# Patient Record
Sex: Male | Born: 1985 | Race: White | Hispanic: No | Marital: Single | Smoking: Never smoker
Health system: Southern US, Community
[De-identification: ages and names within clinical notes are randomized; demographics above are authoritative.]

## PROBLEM LIST (undated history)

## (undated) DIAGNOSIS — F419 Anxiety disorder, unspecified: Secondary | ICD-10-CM

## (undated) DIAGNOSIS — F1520 Other stimulant dependence, uncomplicated: Secondary | ICD-10-CM

## (undated) DIAGNOSIS — B20 Human immunodeficiency virus [HIV] disease: Secondary | ICD-10-CM

## (undated) DIAGNOSIS — F1910 Other psychoactive substance abuse, uncomplicated: Secondary | ICD-10-CM

## (undated) DIAGNOSIS — A539 Syphilis, unspecified: Secondary | ICD-10-CM

## (undated) DIAGNOSIS — Z21 Asymptomatic human immunodeficiency virus [HIV] infection status: Secondary | ICD-10-CM

## (undated) HISTORY — DX: Other psychoactive substance abuse, uncomplicated: F19.10

## (undated) HISTORY — DX: Anxiety disorder, unspecified: F41.9

## (undated) HISTORY — DX: Human immunodeficiency virus (HIV) disease: B20

## (undated) HISTORY — DX: Other stimulant dependence, uncomplicated: F15.20

---
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
## 2013-01-05 DIAGNOSIS — B20 Human immunodeficiency virus [HIV] disease: Secondary | ICD-10-CM | POA: Insufficient documentation

## 2015-07-17 ENCOUNTER — Emergency Department
Admission: EM | Admit: 2015-07-17 | Discharge: 2015-07-17 | Disposition: A | Discharge status: Home / Self Care | Payer: 59 | Attending: Emergency Medicine | Admitting: Emergency Medicine

## 2015-07-17 ENCOUNTER — Emergency Department: Payer: Self-pay

## 2015-07-17 DIAGNOSIS — Z21 Asymptomatic human immunodeficiency virus [HIV] infection status: Secondary | ICD-10-CM | POA: Insufficient documentation

## 2015-07-17 DIAGNOSIS — F1910 Other psychoactive substance abuse, uncomplicated: Secondary | ICD-10-CM

## 2015-07-17 DIAGNOSIS — F15929 Other stimulant use, unspecified with intoxication, unspecified: Secondary | ICD-10-CM | POA: Insufficient documentation

## 2015-07-17 NOTE — Discharge Instructions (Signed)
Methamphetamine Abuse (Edu)     Your doctor wanted you to have this information about methamphetamine use.     What is it?     Methamphetamine is a psycho stimulant. This means that it stimulates the brain. It is also known as meth, speed, crystal meth, ice, glass, and several others.     How is it used?     Meth can be injected directly into the blood vessels, smoked, snorted, or swallowed. All routes are harmful, but injection into the veins (called the intravenous route) is the most dangerous.      Methamphetamines are sometimes found in FDA approved drugs for ADHD, narcolepsy, and morbid obesity. However, these are controlled substances and are not often prescribed.      What are the side-effects?     There are a wide range of side-effects of methamphetamines. The most common ones are psychosis, hyperactivity, low appetite, confusion, sweating, nausea, diarrhea, dangerously high body temperature, and anxiety.     Meth works by releasing high levels of neurotransmitters in the brain. Neurotransmitters let the brain cells communicate with each other. When these levels of neurotransmitters get very high, they can cause a person to have confusion, hallucinations, anxiety, trouble sleeping, seizures, and paranoia.      With long-term use, methamphetamines can cause behavioral problems. These problems may look like schizophrenia and Parkinson s disease, strokes, depression, or higher risk for suicide. It can also cause permanent damage to the lungs, heart, and blood vessels. Death is a very real complication both of short-term and long-term use.      Another common side effect of long-term methamphetamine use is "meth mouth." Meth users can quickly develop extremely poor dental hygiene. This often causes teeth to fall out and infected gums.      Methamphetamine is an extremely addictive drug. It can also cause tolerance. This means that a person has to use higher doses of the drug to get the same effect over time.  Withdrawal from the drug can last from days to weeks. Symptoms include headaches, very vivid dreams, more appetite, and depression.     Since methamphetamines are recreational drugs, they are often supplemented or "cut" with other unknown substances. As these substances are unknown, they also carry a risk for bad outcomes and complications.     Where to go from here?     Clearly, stopping all methamphetamine use is the best way to prevent long-term complications of the drug and the best way to avoid death. There are drugs that can help lower the side effects. However, they need to be prescribed by a doctor. It is important to be seen on a regular basis by a doctor to improve your chances of successfully quitting methamphetamines.

## 2015-07-17 NOTE — ED Provider Notes (Signed)
EMERGENCY DEPARTMENT HISTORY AND PHYSICAL EXAM     Physician/Midlevel provider first contact with patient: 07/17/15 0358         Date: 07/17/2015  Patient Name: Stephen Norris    History of Presenting Illness     Chief Complaint   Patient presents with   . Addiction Problem   . Withdrawal       History Provided By: Patient    Chief Complaint: Meth addiction   Onset: years ago  Timing: Constant  Location: Psychological  Quality: While being on methamphetamines  Severity: Moderate  Exacerbating factors: Meth use  Alleviating factors: None  Associated Symptoms: paranoia  Pertinent Negatives: No fever, nausea, vomiting, headache, neck pain, back pain, fall, trauma, SI, HI, or other illicit drug use    Additional History: Stephen Norris is a 30 y.o. male presenting to the ED with meth addiction. Pt thinks that he was being followed by the authorities. He says he is on methamphetamines, while this was happening, but thought that undercover cops were going to perform a "sting operation" on him. He was fired from his job recently in [place] and has been living with friends in [place] since then. Pt last used meth a few hours ago. He came to ED because he believes he is being followed by the police and that the ED is the only place where they cannot follow him. Pt was planning to go to rehab for methamphetamines. He also has HIV, not currently on medication.     PCP: Pcp, Noneorunknown, MD  SPECIALISTS:    No current facility-administered medications for this encounter.     Current Outpatient Prescriptions   Medication Sig Dispense Refill   . ALPRAZolam (XANAX) 1 MG tablet Take 1 mg by mouth nightly as needed for Sleep.     Marland Kitchen elvitegravir, cobicistat, emtricitabine, and tenofovir alafenamide (GENVOYA) 150-150-200-10 MG tablet Take 1 tablet by mouth daily.     . valacyclovir (VALTREX) 1000 MG tablet Take 1,000 mg by mouth 2 (two) times daily.         Past History     Past Medical History:  Past Medical History   Diagnosis Date    . HIV (human immunodeficiency virus infection)    . Anxiety    . Methamphetamine addiction        Past Surgical History:  Past Surgical History   Procedure Laterality Date   . Tonsillectomy     . Wisdom tooth extraction         Family History:  No family history on file.    Social History:  Social History   Substance Use Topics   . Smoking status: Never Smoker    . Smokeless tobacco: None   . Alcohol Use: No       Allergies:  No Known Allergies    Review of Systems     Review of Systems   Constitutional: Negative for fever and fatigue.   HENT: Negative for rhinorrhea and sore throat.    Eyes: Negative for discharge, redness and visual disturbance.   Respiratory: Negative for cough and shortness of breath.    Cardiovascular: Negative for chest pain and leg swelling.   Gastrointestinal: Negative for nausea and abdominal pain.   Endocrine: Negative for polyuria.   Genitourinary: Negative for dysuria, urgency, frequency and flank pain.   Musculoskeletal: Negative for back pain, neck pain and neck stiffness.   Skin: Negative for rash.   Allergic/Immunologic: Negative for food allergies.   Neurological:  Negative for light-headedness and headaches.   Hematological: Does not bruise/bleed easily.   Psychiatric/Behavioral: Negative for suicidal ideas.       Physical Exam   BP 121/81 mmHg  Pulse 96  Temp(Src) 97.5 F (36.4 C) (Oral)  Resp 20  SpO2 97%    Constitutional: Vital signs reviewed.  Well nourished  Head: Normocephalic, atraumatic  Eyes: Conjunctiva and sclera are normal.  No injection or discharge.  Ears, Nose, Throat:  Normal external examination of the nose and ears. No throat or oropharyngeal swelling or erythema. Midline uvula. Mucous membranes moist.  Neck: Normal range of motion. Supple, no meningeal signs. Trachea midline. No stridor. No JVD  Respiratory/Chest: Clear to auscultation. No respiratory distress.   Cardiovascular: Regular rate and rhythm. No murmurs.  Abdomen:  Bowel sounds intact. No  rebound or guarding. Soft.  Non-tender.  Back: No CVA tenderness to percussion. No focal tenderness.  Upper Extremity:  No edema. No cyanosis. Bilateral radial pulses intact and equal.   Lower Extremity:  No edema. No cyanosis. Bilateral calves symmetrical and non-tender. Bilateral femoral, DP, PT pulses intact and equal.  Skin: Warm and dry. No rash.  Neuro: A&Ox3. CNII -XII intact to testing. Strength 5/5 and symmetrical in the bilateral upper and lower extremities. Sensation to sharp touch intact and equal in the bilateral upper and lower extremities. Coordination intact to finger to nose testing. Normal gait.   Psychiatric: Anxious affect.  Denies SI or HI        Diagnostic Study Results     Labs -     Results     ** No results found for the last 24 hours. **          Radiologic Studies -   Radiology Results (24 Hour)     ** No results found for the last 24 hours. **      .    Medical Decision Making   I am the first provider for this patient.    I reviewed the vital signs, available nursing notes, past medical history, past surgical history, family history and social history.    Vital Signs-Reviewed the patient's vital signs.     Patient Vitals for the past 12 hrs:   BP Temp Pulse Resp   07/17/15 0400 121/81 mmHg 97.5 F (36.4 C) 96 20       Pulse Oximetry Analysis - Normal 97% on RA    Old Medical Records:   Nursing notes.     ED Course:     4:31 AM - Discussed with pt and counseled on diagnosis, f/u plans with IPAC, and signs and symptoms when to return to ED.  Pt voices understanding and agreement with plan. All questions and concerns addressed.         Provider Notes: Pt presenting to ED with meth intoxication which has been ongoing for years. Provided referral for outpatient resources.       Diagnosis     Clinical Impression:   1. Substance abuse        Treatment Plan:   ED Disposition     Discharge Stephen Norris discharge to home/self care.    Condition at disposition: Stable               _______________________________      Attestations: This note is prepared by Areatha Keas, acting as scribe for Lynnea Ferrier, MD.    Lynnea Ferrier, MD - The scribe's documentation has been prepared under my direction and  personally reviewed by me in its entirety.  I confirm that the note above accurately reflects all work, treatment, procedures, and medical decision making performed by me.    _______________________________    Maryella Shivers, MD  07/17/15 [phone]

## 2015-07-17 NOTE — ED Notes (Signed)
Bed: BL2  Expected date:   Expected time:   Means of arrival:   Comments:  Voluntary

## 2015-07-17 NOTE — ED Notes (Signed)
Pt was BIB [facility] as voluntary for help with his meth addiction. Pt denies SI or HI and appears to be anxious. BP 121/81 mmHg  Pulse 96  Temp(Src) 97.5 F (36.4 C) (Oral)  Resp 20  SpO2 97%

## 2016-01-06 DIAGNOSIS — A539 Syphilis, unspecified: Secondary | ICD-10-CM

## 2016-01-06 HISTORY — DX: Syphilis, unspecified: A53.9

## 2018-09-29 ENCOUNTER — Ambulatory Visit (HOSPITAL_COMMUNITY)
Admission: EM | Admit: 2018-09-29 | Discharge: 2018-09-29 | Disposition: A | Discharge status: Home / Self Care | Payer: Self-pay | Attending: Internal Medicine | Admitting: Internal Medicine

## 2018-09-29 ENCOUNTER — Encounter (HOSPITAL_COMMUNITY): Payer: Self-pay

## 2018-09-29 ENCOUNTER — Other Ambulatory Visit: Payer: Self-pay

## 2018-09-29 DIAGNOSIS — Z202 Contact with and (suspected) exposure to infections with a predominantly sexual mode of transmission: Secondary | ICD-10-CM

## 2018-09-29 DIAGNOSIS — Z9114 Patient's other noncompliance with medication regimen: Secondary | ICD-10-CM

## 2018-09-29 DIAGNOSIS — B20 Human immunodeficiency virus [HIV] disease: Secondary | ICD-10-CM | POA: Insufficient documentation

## 2018-09-29 DIAGNOSIS — Z760 Encounter for issue of repeat prescription: Secondary | ICD-10-CM | POA: Insufficient documentation

## 2018-09-29 DIAGNOSIS — N485 Ulcer of penis: Secondary | ICD-10-CM

## 2018-09-29 HISTORY — DX: Human immunodeficiency virus (HIV) disease: B20

## 2018-09-29 HISTORY — DX: Asymptomatic human immunodeficiency virus (hiv) infection status: Z21

## 2018-09-29 LAB — CBC WITH DIFFERENTIAL/PLATELET
Abs Immature Granulocytes: 0.03 10*3/uL (ref 0.00–0.07)
Basophils Absolute: 0.1 10*3/uL (ref 0.0–0.1)
Basophils Relative: 1 %
Eosinophils Absolute: 0.3 10*3/uL (ref 0.0–0.5)
Eosinophils Relative: 3 %
HCT: 45.8 % (ref 39.0–52.0)
Hemoglobin: 15.8 g/dL (ref 13.0–17.0)
Immature Granulocytes: 0 %
Lymphocytes Relative: 31 %
Lymphs Abs: 3 10*3/uL (ref 0.7–4.0)
MCH: 31.6 pg (ref 26.0–34.0)
MCHC: 34.5 g/dL (ref 30.0–36.0)
MCV: 91.6 fL (ref 80.0–100.0)
Monocytes Absolute: 1 10*3/uL (ref 0.1–1.0)
Monocytes Relative: 10 %
Neutro Abs: 5.5 10*3/uL (ref 1.7–7.7)
Neutrophils Relative %: 55 %
Platelets: 335 10*3/uL (ref 150–400)
RBC: 5 MIL/uL (ref 4.22–5.81)
RDW: 12.5 % (ref 11.5–15.5)
WBC: 9.8 10*3/uL (ref 4.0–10.5)
nRBC: 0 % (ref 0.0–0.2)

## 2018-09-29 LAB — COMPREHENSIVE METABOLIC PANEL
ALT: 16 U/L (ref 0–44)
AST: 21 U/L (ref 15–41)
Albumin: 4 g/dL (ref 3.5–5.0)
Alkaline Phosphatase: 86 U/L (ref 38–126)
Anion gap: 8 (ref 5–15)
BUN: 11 mg/dL (ref 6–20)
CO2: 28 mmol/L (ref 22–32)
Calcium: 9 mg/dL (ref 8.9–10.3)
Chloride: 102 mmol/L (ref 98–111)
Creatinine, Ser: 1.05 mg/dL (ref 0.61–1.24)
GFR calc Af Amer: >60 mL/min (ref >60)
GFR calc non Af Amer: >60 mL/min (ref >60)
Glucose, Bld: 77 mg/dL (ref 70–99)
Potassium: 3.9 mmol/L (ref 3.5–5.1)
Sodium: 138 mmol/L (ref 135–145)
Total Bilirubin: 0.6 mg/dL (ref 0.3–1.2)
Total Protein: 7.6 g/dL (ref 6.5–8.1)

## 2018-09-29 MED ORDER — DOLUTEGRAVIR SODIUM 50 MG PO TABS: 50.0000 mg | ORAL_TABLET | Freq: Every day | ORAL | Status: DC

## 2018-09-29 MED ORDER — PENICILLIN G BENZATHINE 1200000 UNIT/2ML IM SUSP
2.4000 10*6.[IU] | Freq: Once | INTRAMUSCULAR | Status: AC
  Administered 2018-09-29: 2.4 10*6.[IU] via INTRAMUSCULAR

## 2018-09-29 MED ORDER — TIVICAY 50 MG PO TABS: 50.0000 mg | ORAL_TABLET | Freq: Every day | ORAL | 3 refills | Status: DC

## 2018-09-29 MED ORDER — DARUN-COBIC-EMTRICIT-TENOFAF 800-150-200-10 MG PO TABS: 1.0000 | ORAL_TABLET | Freq: Every day | ORAL | Status: DC

## 2018-09-29 MED ORDER — PENICILLIN G BENZATHINE 1200000 UNIT/2ML IM SUSP
INTRAMUSCULAR | Status: AC
  Filled 2018-09-29: qty 4

## 2018-09-29 MED ORDER — SYMTUZA 800-150-200-10 MG PO TABS: 1.0000 | ORAL_TABLET | Freq: Every day | ORAL | 3 refills | Status: DC

## 2018-09-29 NOTE — ED Triage Notes (Addendum)
Pt states he needs HIV meds refill and he wants to be tested for STD's. ( Symtuza, Tivicay )

## 2018-09-29 NOTE — ED Provider Notes (Signed)
[facility]    CSN: [id number] Arrival date & time: 09/29/18  1657      History   Chief Complaint Chief Complaint  Patient presents with   Medication Refill   SEXUALLY TRANSMITTED DISEASE    HPI Billy Curry is a 33 y.o. male with a history of HIV noncompliant with his medications comes to urgent care requesting medication refills.  He is on Symtuza 1 tablet orally daily and Tivicay 50 mg tablets 1 tablet orally daily.  He has been without his medication since February.  His insurance is through [employer] [place] but he lives here in [place].  Patient also complains of penile ulcers which are painless.  No penile discharge.  No testicular swelling or pain.  No groin swelling.  Patient remains sexually active and is been engaged in unprotected sexual intercourse.  He has been treated for syphilis in the past. HPI  Past Medical History:  Diagnosis Date   HIV (human immunodeficiency virus infection) ([place])     There are no active problems to display for this patient.   History reviewed. No pertinent surgical history.     Home Medications    Prior to Admission medications   Medication Sig Start Date End Date Taking? Authorizing Provider  Darunavir-Cobicisctat-Emtricitabine-Tenofovir Alafenamide (SYMTUZA) 800-150-200-10 MG TABS Take 1 tablet by mouth daily with breakfast. 09/29/18   Tymira Horkey, Myrene Galas, MD  dolutegravir (TIVICAY) 50 MG tablet Take 1 tablet (50 mg total) by mouth daily. 09/29/18   Amazing Cowman, Myrene Galas, MD    Family History History reviewed. No pertinent family history.  Social History Social History   Tobacco Use   Smoking status: Never Smoker   Smokeless tobacco: Never Used  Substance Use Topics   Alcohol use: Never    Frequency: Never   Drug use: Not on file     Allergies   Patient has no known allergies.   Review of Systems Review of Systems  Constitutional: Negative.   HENT: Negative.   Eyes: Negative.    Respiratory: Negative.   Genitourinary: Positive for genital sores. Negative for discharge, dysuria, flank pain, frequency, penile pain, penile swelling, scrotal swelling, testicular pain and urgency.  Musculoskeletal: Negative.   Neurological: Negative.      Physical Exam Triage Vital Signs ED Triage Vitals  Enc Vitals Group     BP 09/29/18 1733 140/78     Pulse Rate 09/29/18 1733 88     Resp 09/29/18 1733 18     Temp 09/29/18 1733 98.2 F (36.8 C)     Temp Source 09/29/18 1733 Oral     SpO2 09/29/18 1733 98 %     Weight 09/29/18 1735 160 lb (72.6 kg)     Height --      Head Circumference --      Peak Flow --      Pain Score --      Pain Loc --      Pain Edu? --      Excl. in [place]? --    No data found.  Updated Vital Signs BP 140/78 (BP Location: Right Arm)    Pulse 88    Temp 98.2 F (36.8 C) (Oral)    Resp 18    Wt 72.6 kg    SpO2 98%   Visual Acuity Right Eye Distance:   Left Eye Distance:   Bilateral Distance:    Right Eye Near:   Left Eye Near:    Bilateral Near:  Physical Exam Constitutional:      Appearance: He is not ill-appearing or toxic-appearing.  HENT:     Right Ear: Tympanic membrane normal.     Left Ear: Tympanic membrane normal.  Cardiovascular:     Rate and Rhythm: Normal rate and regular rhythm.     Pulses: Normal pulses.     Heart sounds: Normal heart sounds.  Pulmonary:     Effort: Pulmonary effort is normal.     Breath sounds: Normal breath sounds.  Abdominal:     General: Bowel sounds are normal.     Palpations: Abdomen is soft.  Genitourinary:    Comments: Multiple shallow ulcerations on the shaft of the penis.  No surrounding erythema. Musculoskeletal: Normal range of motion.  Skin:    General: Skin is warm and dry.     Capillary Refill: Capillary refill takes less than 2 seconds.  Neurological:     Mental Status: He is alert.      UC Treatments / Results  Labs (all labs ordered are listed, but only abnormal results  are displayed) Labs Reviewed  CBC WITH DIFFERENTIAL/PLATELET  COMPREHENSIVE METABOLIC PANEL  RPR  T-HELPER CELLS (CD4) COUNT (NOT AT [facility])  HIV-1 RNA QUANT-NO REFLEX-BLD  CYTOLOGY, (ORAL, ANAL, URETHRAL) ANCILLARY ONLY    EKG   Radiology No results found.  Procedures Procedures (including critical care time)  Medications Ordered in UC Medications  penicillin g benzathine (BICILLIN LA) 1200000 UNIT/2ML injection 2.4 Million Units (has no administration in time range)  penicillin g benzathine (BICILLIN LA) 1200000 UNIT/2ML injection (has no administration in time range)    Initial Impression / Assessment and Plan / UC Course  I have reviewed the triage vital signs and the nursing notes.  Pertinent labs & imaging results that were available during my care of the patient were reviewed by me and considered in my medical decision making (see chart for details).     1.  HIV, patient noncompliant with medications: CBC, CMP, CD4 count and viral loads ordered Refilled Symtuza and Tivicay Patient is advised to follow-up with infectious disease specialist here if the anticipate staying in this area for a long period of time  2.  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 Provider   dolutegravir (TIVICAY) 50 MG tablet Take 1 tablet (50 mg total) by mouth daily. 30 tablet Arnesia Vincelette, Britta Mccreedy, MD   Darunavir-Cobicisctat-Emtricitabine-Tenofovir Alafenamide (SYMTUZA) 800-150-200-10 MG TABS Take 1 tablet by mouth daily with breakfast. 30 tablet Kalinda Romaniello, Britta Mccreedy, MD     PDMP not reviewed this encounter.   Merrilee Jansky, MD 09/29/18 [phone]

## 2018-09-30 ENCOUNTER — Telehealth (HOSPITAL_COMMUNITY): Payer: Self-pay | Admitting: Emergency Medicine

## 2018-09-30 LAB — HIV-1 RNA QUANT-NO REFLEX-BLD
HIV 1 RNA Quant: 29300 copies/mL
LOG10 HIV-1 RNA: 4.467 log10copy/mL

## 2018-09-30 LAB — T-HELPER CELLS (CD4) COUNT (NOT AT ARMC)
CD4 % Helper T Cell: 18 % — ABNORMAL LOW (ref 33–65)
CD4 T Cell Abs: 461 /uL (ref 400–1790)

## 2018-09-30 LAB — RPR
RPR Ser Ql: REACTIVE — AB
RPR Titer: 1:32 {titer}

## 2018-09-30 NOTE — Telephone Encounter (Signed)
Attempted to reach patient. No answer at this time. Voicemail left.    

## 2018-10-01 DIAGNOSIS — A539 Syphilis, unspecified: Secondary | ICD-10-CM | POA: Insufficient documentation

## 2018-10-03 ENCOUNTER — Telehealth (HOSPITAL_COMMUNITY): Payer: Self-pay | Admitting: Emergency Medicine

## 2018-10-03 ENCOUNTER — Other Ambulatory Visit: Payer: Self-pay

## 2018-10-03 ENCOUNTER — Encounter: Payer: Self-pay | Admitting: Infectious Diseases

## 2018-10-03 ENCOUNTER — Ambulatory Visit: Payer: Self-pay

## 2018-10-03 LAB — T.PALLIDUM AB, TOTAL: T Pallidum Abs: REACTIVE — AB

## 2018-10-03 NOTE — Telephone Encounter (Signed)
Cytology still pending, confirmation test pending, Attempted to reach patient x2. No answer at this time. Voicemail left. [facility] notified of possible syphilis.

## 2018-10-03 NOTE — Telephone Encounter (Signed)
Cytology still pending, confirmation test pending, Attempted to reach patient x2. No answer at this time. Voicemail left. [facility] notified of possible syphilis.  

## 2018-10-05 LAB — CYTOLOGY, (ORAL, ANAL, URETHRAL) ANCILLARY ONLY
Chlamydia: NEGATIVE
Neisseria Gonorrhea: NEGATIVE
Trichomonas: NEGATIVE

## 2018-10-06 ENCOUNTER — Telehealth (HOSPITAL_COMMUNITY): Payer: Self-pay | Admitting: Emergency Medicine

## 2018-10-06 NOTE — Telephone Encounter (Signed)
Attempted to reach patient x3. No answer at this time. Voicemail full. Letter sent

## 2018-10-06 NOTE — Telephone Encounter (Signed)
Attempted to reach patient x3. No answer at this time. Voicemail full. Letter sent    

## 2018-10-13 ENCOUNTER — Encounter: Payer: Self-pay | Admitting: Infectious Diseases

## 2018-10-13 ENCOUNTER — Ambulatory Visit (INDEPENDENT_AMBULATORY_CARE_PROVIDER_SITE_OTHER): Payer: Self-pay | Admitting: Infectious Diseases

## 2018-10-13 ENCOUNTER — Other Ambulatory Visit: Payer: Self-pay

## 2018-10-13 VITALS — BP 135/88 | HR 100 | Temp 97.7°F

## 2018-10-13 DIAGNOSIS — A539 Syphilis, unspecified: Secondary | ICD-10-CM

## 2018-10-13 DIAGNOSIS — B20 Human immunodeficiency virus [HIV] disease: Secondary | ICD-10-CM

## 2018-10-13 DIAGNOSIS — A5139 Other secondary syphilis of skin: Secondary | ICD-10-CM

## 2018-10-13 DIAGNOSIS — F1510 Other stimulant abuse, uncomplicated: Secondary | ICD-10-CM

## 2018-10-13 DIAGNOSIS — Z23 Encounter for immunization: Secondary | ICD-10-CM

## 2018-10-13 MED ORDER — TIVICAY 50 MG PO TABS: 50.0000 mg | ORAL_TABLET | Freq: Every day | ORAL | 1 refills | Status: DC

## 2018-10-13 MED ORDER — SYMTUZA 800-150-200-10 MG PO TABS: 1.0000 | ORAL_TABLET | Freq: Every day | ORAL | 1 refills | Status: DC

## 2018-10-13 MED ORDER — PENICILLIN G BENZATHINE 1200000 UNIT/2ML IM SUSP
1.2000 10*6.[IU] | Freq: Once | INTRAMUSCULAR | Status: AC
  Administered 2018-10-13: 1.2 10*6.[IU] via INTRAMUSCULAR

## 2018-10-13 NOTE — Progress Notes (Signed)
Billy Curry  [id number]  June 30, 1985    HPI: The patient is a 33 y.o. y/o white male  presenting today to establish care for HIV infection. He is transferring his care from [facility] in [place], [place]. He also has received prior HIV care in [place], [place]. He was last seen there via e-visit in 07/2018. His most recent CD4 count was 461 and concurrent HIV viral load was 29,300 copies on no ARVs as he has been off his Symtuza and Tivicay for the last 2 to 3 weeks at least. His CD4 nadir is 295 in 02/2018. His HIV was diagnosed in 6/15 in [place], [place]. His past ARV experience includes complera, genvoya, symtuza, and tivicay. He also had a positive RPR with a titer of 1:32. Prior changes in ARVs were not known to be due to the development of ARV resistance but were made in settings in which the patient had been previously noncompliant with his Genvoya. 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 Review of prior HIV records does not indicate good compliance with ARV medications.  Regarding the patient's syphilis testing, he states he was previously treated approximately 2 years ago.  His RPR titers were not sent with his records from [place].  He admits to frequent unprotected intercourse (both insertive and receptive) with most of his sexual partners who are male.  He rarely asks his partners' HIV status and has not been in a stable relationship in many years.  He recently presented to [facility] ER with several penile ulcers and was presumptively treated for syphilis with Bicillin 2,400,000 units IM on October 01, 2018.  He also has a disseminated rash notable on exam today.  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  He has notable lesions to both of his arms, concerning for  track marks and subcutaneous abscesses at today's visit.  Behaviorally, the patient is quite aggressive, bordering on verbally beligerent at times and uncooperative with many parts of the interview and exam.   Past Medical History:  Diagnosis Date   HIV (human immunodeficiency virus infection) ([place])     No past surgical history on file.   No family history on file. Both parents alive and healthy. He has no children.  Social History   Tobacco Use   Smoking status: Never Smoker   Smokeless tobacco: Never Used  Substance Use Topics   Alcohol use: Never    Frequency: Never   Drug use: Not on file      reports being sexually active. +MSM, rare condom use, both receptive and insertive intercourse reported; most recent sexual encounter was 2-3 weeks ago  Outpatient Medications Prior to Visit  Medication Sig Dispense Refill   Darunavir-Cobicisctat-Emtricitabine-Tenofovir Alafenamide (SYMTUZA) 800-150-200-10 MG TABS Take 1 tablet by mouth daily with breakfast. (Patient not taking: Reported on 10/13/2018) 30 tablet 3   dolutegravir (TIVICAY) 50 MG tablet Take 1 tablet (50 mg total) by mouth daily. (Patient not taking: Reported on 10/13/2018) 30 tablet 3   No facility-administered medications prior to visit.      No Known Allergies   Review of Systems  Constitutional: Negative for chills, fatigue and fever.  HENT: Negative for congestion, hearing loss, rhinorrhea and sinus pressure.   Eyes: Negative  for photophobia, pain, redness and visual disturbance.  Respiratory: Negative for apnea, cough, shortness of breath and wheezing.   Cardiovascular: Negative for chest pain and palpitations.  Gastrointestinal: Negative for abdominal pain, constipation, diarrhea, nausea and vomiting.  Endocrine: Negative for cold intolerance, heat intolerance, polydipsia and polyuria.  Genitourinary: Negative for decreased urine volume, dysuria, frequency, hematuria and testicular pain.    Musculoskeletal: Negative for back pain, myalgias and neck pain.  Skin: Negative for pallor and rash.  Allergic/Immunologic: Positive for immunocompromised state.  Neurological: Negative for dizziness, seizures, syncope, speech difficulty and light-headedness.  Hematological: Does not bruise/bleed easily.  Psychiatric/Behavioral: Positive for behavioral problems and sleep disturbance. Negative for agitation and hallucinations. The patient is nervous/anxious.      Vitals:   10/13/18 1426  BP: 135/88  Pulse: 100  Temp: 97.7 F (36.5 C)     Physical Exam Gen: anxious, aggressive and verbally beligerent at times, NAD, A&Ox 3 Head: NCAT, no temporal wasting evident EENT: pupils significantly dilated and poorly reactive to light, EOMI, MMM, fair dentition Neck: supple, no JVD CV: tachycardic rate, RR, no murmurs evident Pulm: CTA bilaterally, mild expiratory wheeze, +nasal flaring, no retractions Abd: soft, NTND, +BS GU: no active chancre to penis or testicles, +circumcised Extrems:  trace LE edema, 2+ pulses Skin: multiple track marks to both UEs with 2 prominent fluctuant nodules to RT wrist and RT thenar component without expressible drainage, adequate skin turgor Neuro: CN II-XII grossly intact, no focal neurologic deficits appreciated, gait was normal, A&Ox 3  Psych: anxious, pressured speech, frequently interrupts, racing/tangential thoughts and logic  Labs: Lab Results  Component Value Date   HIV1RNAQUANT 29,300 09/29/2018     Lab Results  Component Value Date   CD4TCELL 18 (L) 09/29/2018   CD4TABS 461 09/29/2018     Lab Results  Component Value Date   WBC 9.8 09/29/2018   HGB 15.8 09/29/2018   HCT 45.8 09/29/2018   MCV 91.6 09/29/2018   PLT 335 09/29/2018       Chemistry      Component Value Date/Time   NA 138 09/29/2018 1832   K 3.9 09/29/2018 1832   CL 102 09/29/2018 1832   CO2 28 09/29/2018 1832   BUN 11 09/29/2018 1832   CREATININE 1.05 09/29/2018  1832      Component Value Date/Time   CALCIUM 9.0 09/29/2018 1832   ALKPHOS 86 09/29/2018 1832   AST 21 09/29/2018 1832   ALT 16 09/29/2018 1832   BILITOT 0.6 09/29/2018 1832        Assessment/Plan: The patient is a 33 year old white male with a 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  HIV - CD4 reconstitution improved with current CD4 count of 461 now but HIV viremia is uncontrolled at 29,300 copies.  Will repeat HIV viral load in 4 weeks prior to next visit and f/u with patient in 6 weeks. ARV regimen of symtuza and tivicay refilled today. I stressed the importance of abstinence from amphetamines and the need for 100% adherence to his ARVs, lab draws, and follow up visits. I am quite concerned re: the patient's past development of ARV resistance and lack of ever achieving virologic control of his HIV over the past 5 years despite efforts to treat. If he does not improve these issues, his life expectancy will be significantly shortened.  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  The patient reports a history of syphilis treated approximately 2 to 3 years ago.  His most recent RPR taken in February was nonreactive however.  Given concerns for secondary syphilis, will repeat 2,400,000 units of IM Bicillin today and have him return in 1 week for a final injection to complete 3 weekly injections since his diagnosis.  We will repeat the patient's RPR in approximately 3 to 4 months to look for a fourfold decline in his RPR titer.  Fortunately, at this time, the patient denies any ocular or auditory complaints.  Similarly, he denies any headaches or balance disturbances worrisome for neurosyphilis, so will defer LP for now.  I stressed to the patient the importance of receiving his final IM penicillin injection, condom use with all sexual  partners until he has shown an adequate response to treatment, and 100% compliance with his antiretrovirals to ensure immune reconstitution.  Amphetamine abuse - The patient's report of amphetamine use as recently as 3 to 4 months ago is not consistent with clinical exam findings today.  Patient's eye dilation, significant psychiatric agitation, and multiple track marks at least 2 of which appear recent are quite concerning for active amphetamine abuse.  A urine drug screen was ordered today to assess for illicit substances given his clinical suspicion which did show amphetamine metabolites.  Patient denies ever receiving formal rehab for his drug abuse.  Unfortunately, he declines psychiatric services at this time.  I do feel that it would be of value for the patient to have an assigned case manager as I suspect that he will continue to remain noncompliant in large part due to his amphetamine abuse as well as poor linkage to care which he has shown in multiple clinics over the last several years.  Health maintenance - No prophylactic meds needed at this time. Pneumococcal vaccination started with prevnar today. He will be due for a pneumovax in 8 weeks. Tdap vaccine next due in 2025. RPR results as noted above. Urine GC/chlamydia screens all negative in 09/2018. Vaccination for hepatitis A & B completed in the past (unverified with records sent), so will check serologies with next labs. Annual TB screening with quantiferon will be drawn with next labs. He is up to date with his annual flu vaccine given today. Check FLP with his next labs for annual cholesterol screening (pt reminded to be fasting at that time). HPV vaccine series will be started at his next visit. Once his HIV viremia is fully suppressed,  He will be referred for annual dental cleaning. Condoms and water based lubricants were advised with all sexual encounters.

## 2018-10-13 NOTE — Patient Instructions (Signed)
Avoid further amphetamine use. Miss no doses of your symtuza and tivicay. Return to clinic in 1 week for next IM penicillin injection. Return to clinic in 4 weeks for repeat labs. Return to clinic in 6 weeks for next appointment with Dr. Prince Rome.

## 2018-10-14 LAB — PAIN MGMT, PROFILE 1 W/O CONF, U
Amphetamines: POSITIVE ng/mL
Barbiturates: NEGATIVE ng/mL
Benzodiazepines: NEGATIVE ng/mL
Cocaine Metabolite: NEGATIVE ng/mL
Creatinine: 219.4 mg/dL
Marijuana Metabolite: NEGATIVE ng/mL
Methadone Metabolite: NEGATIVE ng/mL
Opiates: NEGATIVE ng/mL
Oxidant: NEGATIVE ug/mL
Oxycodone: NEGATIVE ng/mL
Phencyclidine: NEGATIVE ng/mL
pH: 5.2 (ref 4.5–9.0)

## 2018-10-15 ENCOUNTER — Encounter: Payer: Self-pay | Admitting: Infectious Diseases

## 2018-10-15 DIAGNOSIS — F1510 Other stimulant abuse, uncomplicated: Secondary | ICD-10-CM | POA: Insufficient documentation

## 2018-10-20 ENCOUNTER — Ambulatory Visit: Payer: Self-pay

## 2018-10-20 ENCOUNTER — Telehealth: Payer: Self-pay

## 2018-10-20 NOTE — Telephone Encounter (Signed)
Attempted to call patient to follow up on missed nurse visit for Bicillin #3. Unable to reach patient at this time;nor leave voicemail. Patient will need to be seen within 10 days to prevent restarting series.  [place]

## 2018-11-08 ENCOUNTER — Encounter: Payer: Self-pay | Admitting: Infectious Diseases

## 2018-11-09 ENCOUNTER — Other Ambulatory Visit: Payer: Self-pay

## 2018-11-10 ENCOUNTER — Telehealth: Payer: Self-pay

## 2018-11-10 NOTE — Telephone Encounter (Signed)
Attempted to contact patient to reschedule missed appointment. Call went straight to voicemail and mailbox is full

## 2018-11-23 ENCOUNTER — Encounter: Payer: Self-pay | Admitting: Infectious Diseases

## 2018-12-16 ENCOUNTER — Other Ambulatory Visit: Payer: Self-pay | Admitting: Infectious Diseases

## 2018-12-16 ENCOUNTER — Telehealth: Payer: Self-pay
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
## 2019-02-20 ENCOUNTER — Telehealth: Payer: Self-pay

## 2019-02-20 NOTE — Telephone Encounter (Signed)
Received call today from [facility], DIS in regards to patient's syphillis treatment and care. Barbara Cower is confirming patient received  Bicillin during last appointment. Confirmed this per last note. Patient never scheduled third dose. DIS is aware. Will follow up with patient in regards to syphillis treatment and returning to care.  Lorenso Courier, [place]

## 2019-04-02 ENCOUNTER — Emergency Department (HOSPITAL_COMMUNITY): Payer: No Typology Code available for payment source

## 2019-04-02 ENCOUNTER — Emergency Department (HOSPITAL_COMMUNITY)
Admission: EM | Admit: 2019-04-02 | Discharge: 2019-04-02 | Disposition: A | Discharge status: Home / Self Care | Payer: No Typology Code available for payment source | Attending: Emergency Medicine | Admitting: Emergency Medicine

## 2019-04-02 DIAGNOSIS — R0789 Other chest pain: Secondary | ICD-10-CM | POA: Diagnosis not present

## 2019-04-02 DIAGNOSIS — M545 Low back pain: Secondary | ICD-10-CM | POA: Diagnosis not present

## 2019-04-02 DIAGNOSIS — M546 Pain in thoracic spine: Secondary | ICD-10-CM | POA: Insufficient documentation

## 2019-04-02 DIAGNOSIS — M542 Cervicalgia: Secondary | ICD-10-CM | POA: Diagnosis not present

## 2019-04-02 DIAGNOSIS — R519 Headache, unspecified: Secondary | ICD-10-CM | POA: Diagnosis not present

## 2019-04-02 DIAGNOSIS — Y9389 Activity, other specified: Secondary | ICD-10-CM | POA: Diagnosis not present

## 2019-04-02 DIAGNOSIS — S0181XA Laceration without foreign body of other part of head, initial encounter: Secondary | ICD-10-CM

## 2019-04-02 DIAGNOSIS — S199XXA Unspecified injury of neck, initial encounter: Secondary | ICD-10-CM | POA: Diagnosis not present

## 2019-04-02 DIAGNOSIS — Z23 Encounter for immunization: Secondary | ICD-10-CM | POA: Diagnosis not present

## 2019-04-02 DIAGNOSIS — S8011XA Contusion of right lower leg, initial encounter: Secondary | ICD-10-CM | POA: Diagnosis not present

## 2019-04-02 DIAGNOSIS — R1010 Upper abdominal pain, unspecified: Secondary | ICD-10-CM | POA: Diagnosis not present

## 2019-04-02 DIAGNOSIS — S80812A Abrasion, left lower leg, initial encounter: Secondary | ICD-10-CM | POA: Insufficient documentation

## 2019-04-02 DIAGNOSIS — M79672 Pain in left foot: Secondary | ICD-10-CM | POA: Diagnosis not present

## 2019-04-02 DIAGNOSIS — Y999 Unspecified external cause status: Secondary | ICD-10-CM | POA: Diagnosis not present

## 2019-04-02 DIAGNOSIS — S299XXA Unspecified injury of thorax, initial encounter: Secondary | ICD-10-CM | POA: Insufficient documentation

## 2019-04-02 DIAGNOSIS — S0101XA Laceration without foreign body of scalp, initial encounter: Secondary | ICD-10-CM | POA: Insufficient documentation

## 2019-04-02 DIAGNOSIS — T07XXXA Unspecified multiple injuries, initial encounter: Secondary | ICD-10-CM

## 2019-04-02 DIAGNOSIS — Y92410 Unspecified street and highway as the place of occurrence of the external cause: Secondary | ICD-10-CM | POA: Insufficient documentation

## 2019-04-02 DIAGNOSIS — S0990XA Unspecified injury of head, initial encounter: Secondary | ICD-10-CM

## 2019-04-02 LAB — COMPREHENSIVE METABOLIC PANEL
ALT: 17 U/L (ref 0–44)
AST: 27 U/L (ref 15–41)
Albumin: 4.3 g/dL (ref 3.5–5.0)
Alkaline Phosphatase: 65 U/L (ref 38–126)
Anion gap: 11 (ref 5–15)
BUN: 12 mg/dL (ref 6–20)
CO2: 23 mmol/L (ref 22–32)
Calcium: 9.1 mg/dL (ref 8.9–10.3)
Chloride: 102 mmol/L (ref 98–111)
Creatinine, Ser: 1.12 mg/dL (ref 0.61–1.24)
GFR calc Af Amer: >60 mL/min (ref >60)
GFR calc non Af Amer: >60 mL/min (ref >60)
Glucose, Bld: 89 mg/dL (ref 70–99)
Potassium: 4.8 mmol/L (ref 3.5–5.1)
Sodium: 136 mmol/L (ref 135–145)
Total Bilirubin: 1 mg/dL (ref 0.3–1.2)
Total Protein: 7.5 g/dL (ref 6.5–8.1)

## 2019-04-02 LAB — CBC
HCT: 45.2 % (ref 39.0–52.0)
Hemoglobin: 15.3 g/dL (ref 13.0–17.0)
MCH: 30.7 pg (ref 26.0–34.0)
MCHC: 33.8 g/dL (ref 30.0–36.0)
MCV: 90.8 fL (ref 80.0–100.0)
Platelets: 327 10*3/uL (ref 150–400)
RBC: 4.98 MIL/uL (ref 4.22–5.81)
RDW: 12.7 % (ref 11.5–15.5)
WBC: 10.5 10*3/uL (ref 4.0–10.5)
nRBC: 0 % (ref 0.0–0.2)

## 2019-04-02 LAB — ETHANOL: Alcohol, Ethyl (B): <10 mg/dL (ref <10)

## 2019-04-02 MED ORDER — ONDANSETRON HCL 4 MG/2ML IJ SOLN
4.0000 mg | Freq: Once | INTRAMUSCULAR | Status: AC
  Administered 2019-04-02: 4 mg via INTRAVENOUS
  Filled 2019-04-02: qty 2

## 2019-04-02 MED ORDER — SODIUM CHLORIDE 0.9 % IV BOLUS (SEPSIS)
1000.0000 mL | Freq: Once | INTRAVENOUS | Status: AC
  Administered 2019-04-02: 1000 mL via INTRAVENOUS

## 2019-04-02 MED ORDER — IOHEXOL 300 MG/ML  SOLN
100.0000 mL | Freq: Once | INTRAMUSCULAR | Status: AC | PRN
  Administered 2019-04-02: 100 mL via INTRAVENOUS

## 2019-04-02 MED ORDER — TETANUS-DIPHTH-ACELL PERTUSSIS 5-2.5-18.5 LF-MCG/0.5 IM SUSP
0.5000 mL | Freq: Once | INTRAMUSCULAR | Status: AC
  Administered 2019-04-02: 0.5 mL via INTRAMUSCULAR
  Filled 2019-04-02: qty 0.5

## 2019-04-02 MED ORDER — FENTANYL CITRATE (PF) 100 MCG/2ML IJ SOLN
50.0000 ug | Freq: Once | INTRAMUSCULAR | Status: AC
  Administered 2019-04-02: 50 ug via INTRAVENOUS
  Filled 2019-04-02: qty 2

## 2019-04-02 MED ORDER — LIDOCAINE HCL (PF) 1 % IJ SOLN
5.0000 mL | Freq: Once | INTRAMUSCULAR | Status: AC
  Administered 2019-04-02: 06:00:00 5 mL
  Filled 2019-04-02: qty 5

## 2019-04-02 NOTE — Discharge Instructions (Addendum)
You may alternate Tylenol 1000 mg every 6 hours as needed for pain and Ibuprofen 800 mg every 8 hours as needed for pain.  Please take Ibuprofen with food.  Do not take more than 4000 mg of Tylenol (acetaminophen) in a 24 hour period.   Your labs, imaging today were reassuring.   You should have the 2 staples removed from your scalp in approximately 7 to 10 days.  This can be done in the emergency department, urgent care or with your primary care physician.  You may clean these wounds gently with warm soap and water.  I do not recommend antibiotic ointment over the wound to your left forehead given this has glue closing it and antibiotic ointment can cause this glue to break down quickly.
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
## 2019-04-02 NOTE — ED Notes (Signed)
Pt given disposable scrubs.

## 2019-04-02 NOTE — ED Notes (Signed)
Patient verbalizes understanding of discharge instructions. Opportunity for questioning and answers were provided. Armband removed by staff, pt discharged from ED.  

## 2019-04-02 NOTE — ED Notes (Signed)
Pt able to ambulate around room without assist  

## 2020-09-25 ENCOUNTER — Telehealth: Payer: Self-pay | Admitting: *Deleted

## 2020-09-25 NOTE — Telephone Encounter (Signed)
Per chart review, patient was last living in [place], but may have had plans to move to [place] for residential treatment. Will notify [facility]. Andree Coss, RN

## 2020-11-21 ENCOUNTER — Telehealth: Payer: Self-pay

## 2020-11-21 NOTE — Telephone Encounter (Signed)
Per chart review, patient was living in [place]. RN called patient to confirm that he is receiving HIV care, calls could not be completed.   Sandie Ano, RN
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
